# Patient Record
Sex: Female | Born: 1960 | Race: White | Hispanic: No | Smoking: Former smoker
Health system: Southern US, Community
[De-identification: ages and names within clinical notes are randomized; demographics above are authoritative.]

## PROBLEM LIST (undated history)

## (undated) DIAGNOSIS — J45909 Unspecified asthma, uncomplicated: Secondary | ICD-10-CM

## (undated) DIAGNOSIS — I10 Essential (primary) hypertension: Secondary | ICD-10-CM

---

## 1998-04-08 ENCOUNTER — Other Ambulatory Visit: Admission: RE | Admit: 1998-04-08 | Discharge: 1998-04-08 | Payer: Self-pay | Admitting: *Deleted

## 1998-08-30 ENCOUNTER — Encounter: Payer: Self-pay | Admitting: Emergency Medicine

## 1998-08-30 ENCOUNTER — Emergency Department (HOSPITAL_COMMUNITY): Admission: EM | Admit: 1998-08-30 | Discharge: 1998-08-30 | Payer: Self-pay | Admitting: Internal Medicine

## 1999-04-27 ENCOUNTER — Other Ambulatory Visit: Admission: RE | Admit: 1999-04-27 | Discharge: 1999-04-27 | Payer: Self-pay | Admitting: *Deleted

## 2000-05-11 ENCOUNTER — Other Ambulatory Visit: Admission: RE | Admit: 2000-05-11 | Discharge: 2000-05-11 | Payer: Self-pay | Admitting: *Deleted

## 2001-05-11 ENCOUNTER — Other Ambulatory Visit: Admission: RE | Admit: 2001-05-11 | Discharge: 2001-05-11 | Payer: Self-pay | Admitting: *Deleted

## 2005-08-05 ENCOUNTER — Other Ambulatory Visit: Admission: RE | Admit: 2005-08-05 | Discharge: 2005-08-05 | Payer: Self-pay | Admitting: *Deleted

## 2005-12-27 ENCOUNTER — Other Ambulatory Visit: Admission: RE | Admit: 2005-12-27 | Discharge: 2005-12-27 | Payer: Self-pay | Admitting: *Deleted

## 2006-05-10 ENCOUNTER — Other Ambulatory Visit: Admission: RE | Admit: 2006-05-10 | Discharge: 2006-05-10 | Payer: Self-pay | Admitting: *Deleted

## 2010-08-07 ENCOUNTER — Other Ambulatory Visit: Payer: Self-pay | Admitting: Gynecology

## 2010-08-07 DIAGNOSIS — R928 Other abnormal and inconclusive findings on diagnostic imaging of breast: Secondary | ICD-10-CM

## 2010-08-13 ENCOUNTER — Other Ambulatory Visit: Payer: Self-pay

## 2010-09-02 ENCOUNTER — Ambulatory Visit
Admission: RE | Admit: 2010-09-02 | Discharge: 2010-09-02 | Disposition: A | Discharge status: Home / Self Care | Payer: 59 | Source: Ambulatory Visit | Attending: Gynecology | Admitting: Gynecology

## 2010-09-02 DIAGNOSIS — R928 Other abnormal and inconclusive findings on diagnostic imaging of breast: Secondary | ICD-10-CM

## 2010-11-26 ENCOUNTER — Emergency Department (HOSPITAL_COMMUNITY)
Admission: EM | Admit: 2010-11-26 | Discharge: 2010-11-26 | Disposition: A | Discharge status: Other Acute Inpatient Hospital | Payer: 59 | Attending: Emergency Medicine | Admitting: Emergency Medicine

## 2010-11-26 DIAGNOSIS — F489 Nonpsychotic mental disorder, unspecified: Secondary | ICD-10-CM | POA: Insufficient documentation

## 2010-11-26 DIAGNOSIS — T50902A Poisoning by unspecified drugs, medicaments and biological substances, intentional self-harm, initial encounter: Secondary | ICD-10-CM | POA: Insufficient documentation

## 2010-11-26 DIAGNOSIS — F329 Major depressive disorder, single episode, unspecified: Secondary | ICD-10-CM | POA: Insufficient documentation

## 2010-11-26 DIAGNOSIS — T50901A Poisoning by unspecified drugs, medicaments and biological substances, accidental (unintentional), initial encounter: Secondary | ICD-10-CM | POA: Insufficient documentation

## 2010-11-26 DIAGNOSIS — F3289 Other specified depressive episodes: Secondary | ICD-10-CM | POA: Insufficient documentation

## 2010-11-26 DIAGNOSIS — Y92009 Unspecified place in unspecified non-institutional (private) residence as the place of occurrence of the external cause: Secondary | ICD-10-CM | POA: Insufficient documentation

## 2010-11-26 LAB — URINALYSIS, ROUTINE W REFLEX MICROSCOPIC
Bilirubin Urine: NEGATIVE
Glucose, UA: NEGATIVE mg/dL
Ketones, ur: NEGATIVE mg/dL
Leukocytes, UA: NEGATIVE
Nitrite: NEGATIVE
Protein, ur: NEGATIVE mg/dL
pH: 6 (ref 5.0–8.0)

## 2010-11-26 LAB — SALICYLATE LEVEL: Salicylate Lvl: <2 mg/dL — ABNORMAL LOW (ref 2.8–20.0)

## 2010-11-26 LAB — RAPID URINE DRUG SCREEN, HOSP PERFORMED
Amphetamines: NOT DETECTED
Benzodiazepines: POSITIVE — AB
Tetrahydrocannabinol: NOT DETECTED

## 2010-11-26 LAB — DIFFERENTIAL
Basophils Absolute: 0.1 10*3/uL (ref 0.0–0.1)
Basophils Relative: 1 % (ref 0–1)
Eosinophils Absolute: 0.2 10*3/uL (ref 0.0–0.7)
Eosinophils Relative: 2 % (ref 0–5)
Lymphocytes Relative: 20 % (ref 12–46)
Lymphs Abs: 1.6 10*3/uL (ref 0.7–4.0)
Monocytes Absolute: 0.6 10*3/uL (ref 0.1–1.0)
Monocytes Relative: 7 % (ref 3–12)
Neutro Abs: 5.7 10*3/uL (ref 1.7–7.7)
Neutrophils Relative %: 70 % (ref 43–77)

## 2010-11-26 LAB — COMPREHENSIVE METABOLIC PANEL
ALT: 21 U/L (ref 0–35)
AST: 23 U/L (ref 0–37)
Albumin: 3.7 g/dL (ref 3.5–5.2)
CO2: 33 mEq/L — ABNORMAL HIGH (ref 19–32)
Chloride: 100 mEq/L (ref 96–112)
GFR calc non Af Amer: >60 mL/min (ref >60)
Potassium: 4.5 mEq/L (ref 3.5–5.1)
Sodium: 138 mEq/L (ref 135–145)
Total Bilirubin: 0.4 mg/dL (ref 0.3–1.2)

## 2010-11-26 LAB — CBC
Hemoglobin: 14.7 g/dL (ref 12.0–15.0)
MCH: 27.9 pg (ref 26.0–34.0)
Platelets: 339 10*3/uL (ref 150–400)
RBC: 5.27 MIL/uL — ABNORMAL HIGH (ref 3.87–5.11)
WBC: 8.1 10*3/uL (ref 4.0–10.5)

## 2010-11-26 LAB — PREGNANCY, URINE: Preg Test, Ur: NEGATIVE

## 2010-11-26 LAB — ACETAMINOPHEN LEVEL: Acetaminophen (Tylenol), Serum: <15 ug/mL (ref 10–30)

## 2010-11-26 LAB — TRICYCLICS SCREEN, URINE: TCA Scrn: NOT DETECTED

## 2010-11-27 ENCOUNTER — Inpatient Hospital Stay (HOSPITAL_COMMUNITY)
Admission: AD | Admit: 2010-11-27 | Discharge: 2010-11-30 | DRG: 885 | Disposition: A | Discharge status: Home / Self Care | Payer: 59 | Source: Ambulatory Visit | Attending: Psychiatry | Admitting: Psychiatry

## 2010-11-27 DIAGNOSIS — F339 Major depressive disorder, recurrent, unspecified: Secondary | ICD-10-CM

## 2010-11-27 DIAGNOSIS — F411 Generalized anxiety disorder: Secondary | ICD-10-CM

## 2010-11-27 DIAGNOSIS — T424X4A Poisoning by benzodiazepines, undetermined, initial encounter: Secondary | ICD-10-CM

## 2010-11-27 DIAGNOSIS — F332 Major depressive disorder, recurrent severe without psychotic features: Principal | ICD-10-CM

## 2010-11-27 DIAGNOSIS — Z882 Allergy status to sulfonamides status: Secondary | ICD-10-CM

## 2010-11-27 DIAGNOSIS — Z79899 Other long term (current) drug therapy: Secondary | ICD-10-CM

## 2010-11-27 DIAGNOSIS — I10 Essential (primary) hypertension: Secondary | ICD-10-CM

## 2010-11-27 DIAGNOSIS — T43502A Poisoning by unspecified antipsychotics and neuroleptics, intentional self-harm, initial encounter: Secondary | ICD-10-CM

## 2010-11-27 DIAGNOSIS — T438X2A Poisoning by other psychotropic drugs, intentional self-harm, initial encounter: Secondary | ICD-10-CM

## 2010-11-27 DIAGNOSIS — J45909 Unspecified asthma, uncomplicated: Secondary | ICD-10-CM
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
## 2010-11-28 DIAGNOSIS — F411 Generalized anxiety disorder: Secondary | ICD-10-CM

## 2010-11-28 DIAGNOSIS — F339 Major depressive disorder, recurrent, unspecified: Secondary | ICD-10-CM

## 2010-12-02 NOTE — Discharge Summary (Signed)
  NAMEJENINA, Kristin Morrison                ACCOUNT NO.:  [id number]  MEDICAL RECORD NO.:  [id number]  LOCATION:  0500                          FACILITY:  [facility]  PHYSICIAN:  Franchot Gallo, MD     DATE OF BIRTH:  1961/02/07  DATE OF ADMISSION:  11/27/2010 DATE OF DISCHARGE:  11/30/2010                              DISCHARGE SUMMARY   REASON FOR ADMISSION:  This is a 50 year old female that presented after an overdose of 16 to 4 Xanax and drinking approximately 6 beers.  She was having significant stressors, not wanting to "be here."  She had not shown up for work, and her boss had called the patient's mother, and police were dispatched to the patient's home.  FINAL IMPRESSION:  Axis I: 1. Major depressive disorder, recurrent, good control. 2. Generalized anxiety disorder, good control. Axis II:  Deferred. Axis III:  History of asthma under good control and hypertension. Axis IV:  Work-related stress. Axis V:  GAF at discharge was 70.  PERTINENT LABS:  Alcohol level was less than 11.  Urine drug screen positive for benzodiazepines.  Acetaminophen level less than 15.  SIGNIFICANT FINDINGS:  The patient was admitted to the adult milieu.  We continued with her Cymbalta, resumed her losartan and encouraged participation in groups.  She was beginning to feel better.  She was getting some benefit from the Neurontin that was available for her anxiety.  She was denying any suicidal or homicidal thoughts.  She was sleeping, and her appetite was improving.  Her depressive symptoms had lessened rating it a 2 on a scale of 1-10.  She felt that her afternoon dose of Cymbalta was keeping her awake so Cymbalta was changed to all a.m. dosing.  We contacted the patient's mother to gather any safety concerns and for [place] to provide information and suicide prevention and education.  On the day of discharge, the patient reported a problems with decreased sleep but a good appetite, having mild depressive  symptoms rating it a 1-2 on a scale of 1-10.  She denied any suicidal or homicidal thoughts or any psychotic symptoms.  She rated her hopelessness a zero on a scale of 1- 10.  Her Cymbalta was changed to 90 mg in the morning.  DISCHARGE MEDICATIONS:  Her discharge medications included: 1. Gabapentin 100 mg taking 2 t.i.d. 2. Trazodone 50 mg nightly. 3. Cymbalta 30 mg taking 3 capsules daily. 4. Albuterol inhaler as needed. 5. Losartan 50/12.5 mg, 1 daily.  FOLLOWUP:  Her follow-up appointment was with [facility] outpatient with Christella Scheuermann, phone number [phone] on September 7th at 9:00 a.m. and [facility] outpatient with Verne Spurr on October 9th at 8:45.     Landry Corporal, N.P.   ______________________________ Franchot Gallo, MD    JO/MEDQ  D:  11/30/2010  T:  11/30/2010  Job:  [id number]  Electronically Signed by Limmie PatriciaP. on 12/01/2010 09:55:33 AM Electronically Signed by Franchot Gallo MD on 12/02/2010 01:19:16 PM

## 2010-12-04 ENCOUNTER — Ambulatory Visit (HOSPITAL_COMMUNITY): Payer: 59 | Admitting: Psychology

## 2010-12-04 DIAGNOSIS — F411 Generalized anxiety disorder: Secondary | ICD-10-CM

## 2010-12-15 ENCOUNTER — Encounter (HOSPITAL_COMMUNITY): Payer: 59 | Admitting: Psychology

## 2010-12-15 DIAGNOSIS — F332 Major depressive disorder, recurrent severe without psychotic features: Secondary | ICD-10-CM

## 2010-12-25 ENCOUNTER — Encounter (HOSPITAL_COMMUNITY): Payer: 59 | Admitting: Psychology

## 2010-12-29 ENCOUNTER — Encounter (HOSPITAL_COMMUNITY): Payer: 59 | Admitting: Psychology

## 2010-12-29 DIAGNOSIS — F332 Major depressive disorder, recurrent severe without psychotic features: Secondary | ICD-10-CM

## 2011-01-05 ENCOUNTER — Encounter (HOSPITAL_COMMUNITY): Payer: 59 | Admitting: Physician Assistant

## 2011-01-22 ENCOUNTER — Encounter (HOSPITAL_COMMUNITY): Payer: 59 | Admitting: Psychology

## 2011-08-13 ENCOUNTER — Other Ambulatory Visit: Payer: Self-pay | Admitting: Family Medicine

## 2011-08-13 DIAGNOSIS — R319 Hematuria, unspecified: Secondary | ICD-10-CM

## 2011-08-18 ENCOUNTER — Inpatient Hospital Stay: Admission: RE | Admit: 2011-08-18 | Payer: 59 | Source: Ambulatory Visit

## 2011-11-04 ENCOUNTER — Other Ambulatory Visit: Payer: 59

## 2011-11-09 ENCOUNTER — Ambulatory Visit
Admission: RE | Admit: 2011-11-09 | Discharge: 2011-11-09 | Disposition: A | Discharge status: Home / Self Care | Payer: 59 | Source: Ambulatory Visit | Attending: Family Medicine | Admitting: Family Medicine

## 2011-11-09 DIAGNOSIS — R319 Hematuria, unspecified: Secondary | ICD-10-CM

## 2011-11-09 MED ORDER — IOHEXOL 300 MG/ML  SOLN
125.0000 mL | Freq: Once | INTRAMUSCULAR | Status: AC | PRN
  Administered 2011-11-09: 125 mL via INTRAVENOUS

## 2012-08-30 IMAGING — CT CT ABD-PEL WO/W CM
4 of 10 series · 16 of 36 positions shown, 19 images · IV contrast (WATER & [ID] OMNI 300)
Comparison: None.

CLINICAL DATA: Hematuria, bilateral pelvic pain

CT ABDOMEN AND PELVIS WITHOUT AND WITH CONTRAST
TECHNIQUE: Multidetector CT imaging of the abdomen and pelvis was
performed without contrast material in one or both body regions,
followed by contrast material(s) and further sections in one or
both body regions.
Contrast: 125mL OMNIPAQUE IOHEXOL 300 MG/ML  SOLN

[Series 9: abd/pelvis prone · axial · 0.70mm/px · z∈[-242,-112]mm · 2 of 80 slices shown, 5 images]
[im 27/80  soft-tissue]
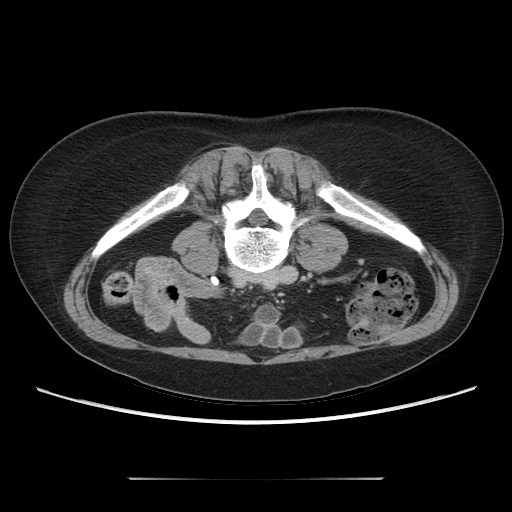
[im 27/80  lung]
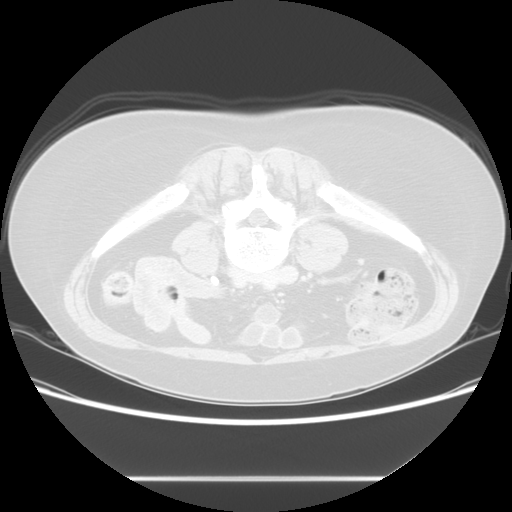
[im 27/80  bone]
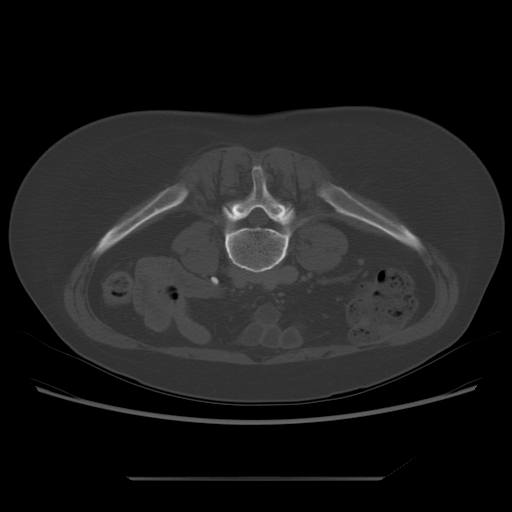
[im 53/80  soft-tissue]
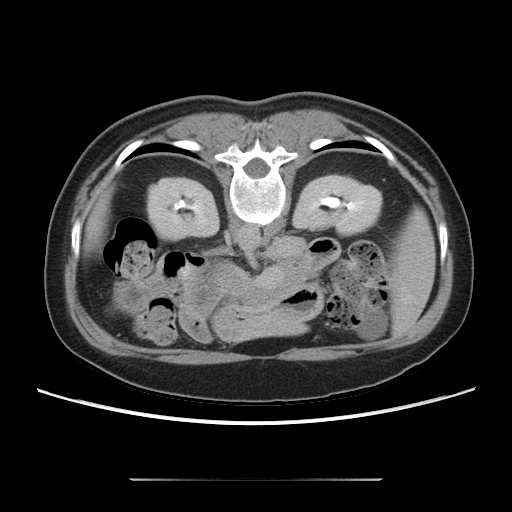
[im 53/80  lung]
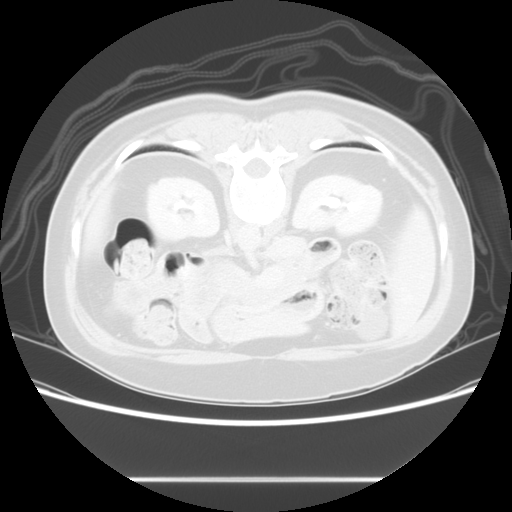

[Series 602: sagittal body · sagittal · 0.89mm/px · 5 of 145 slices shown (1 of 3)]
[im 25/145  soft-tissue]
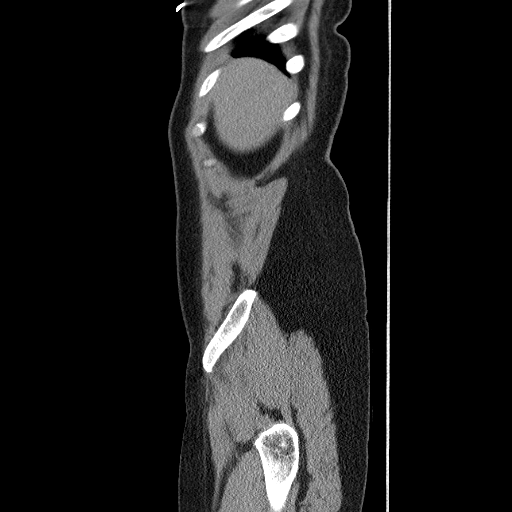
[im 49/145  soft-tissue]
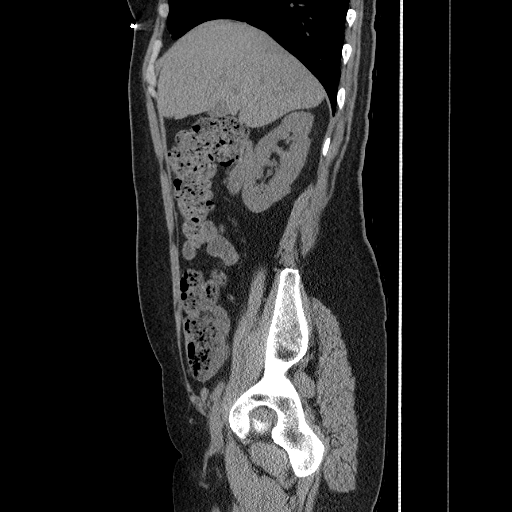
[im 73/145  soft-tissue]
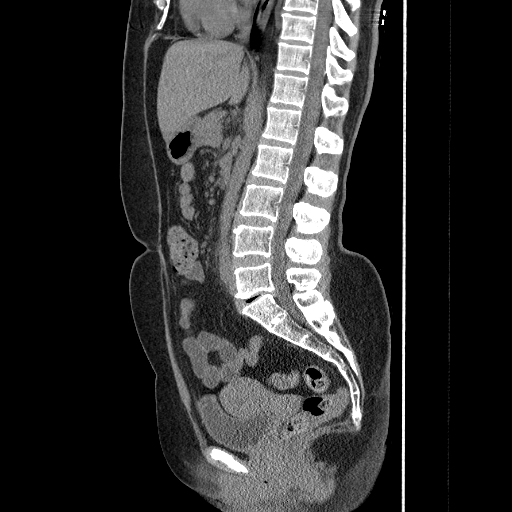
[im 97/145  soft-tissue]
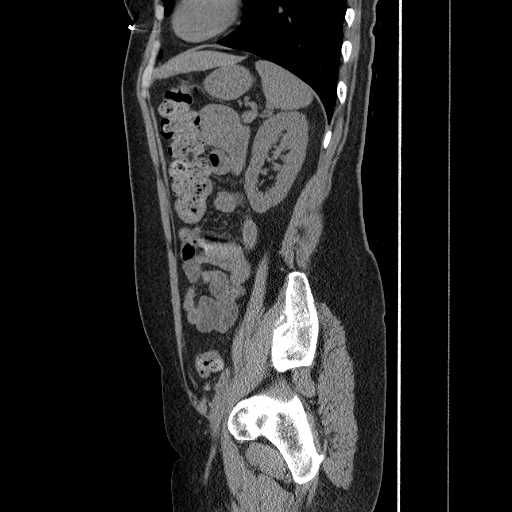
[im 121/145  soft-tissue]
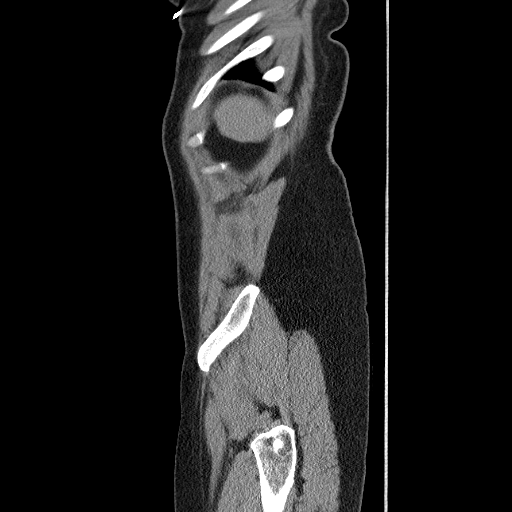

[Series 605: sagittal body · sagittal · 0.91mm/px · 5 of 145 slices shown (2 of 3)]
[im 25/145  soft-tissue]
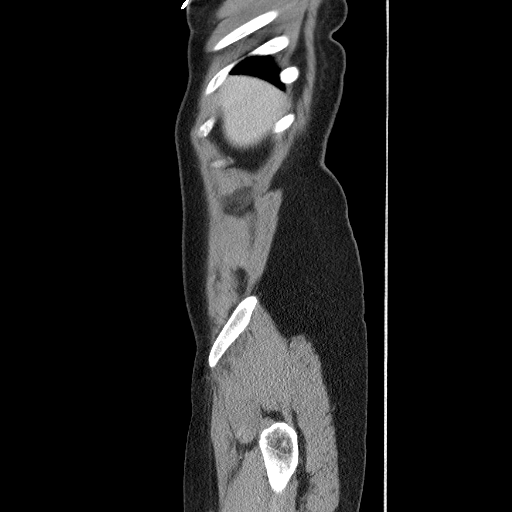
[im 49/145  soft-tissue]
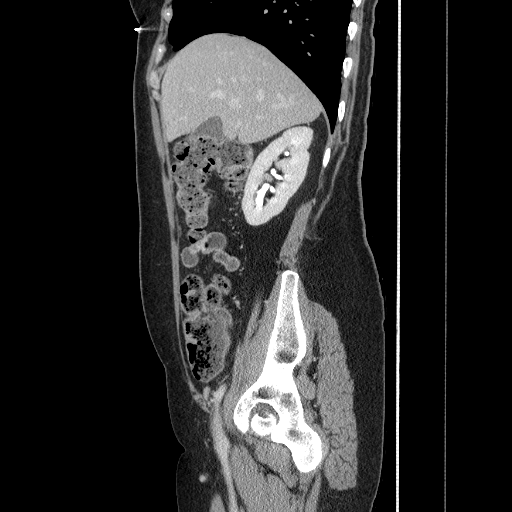
[im 73/145  soft-tissue]
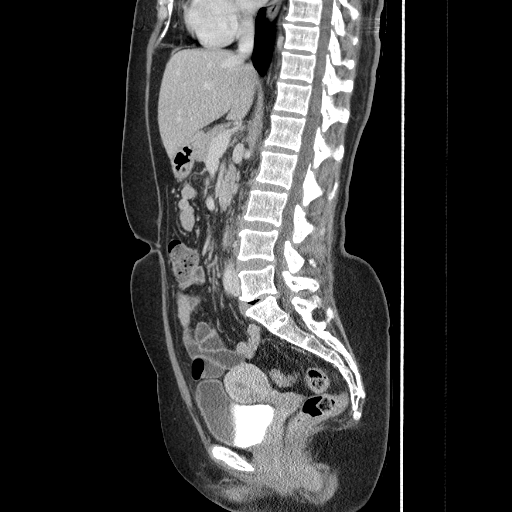
[im 97/145  soft-tissue]
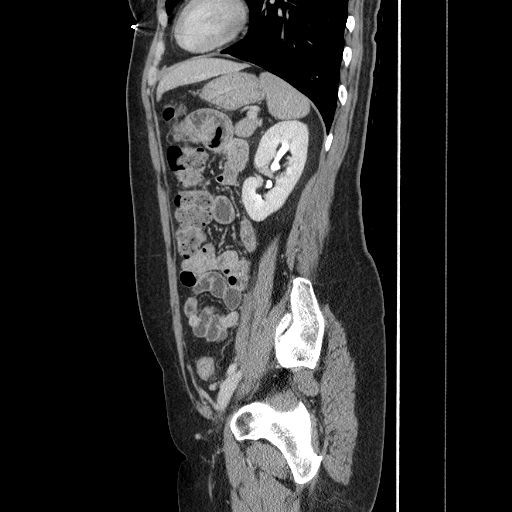
[im 121/145  soft-tissue]
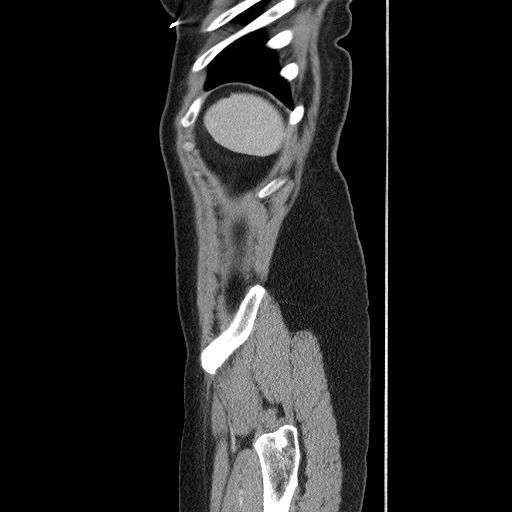

[Series 608: sagittal body · sagittal · 0.92mm/px · 4 of 145 slices shown (3 of 3)]
[im 25/145  soft-tissue]
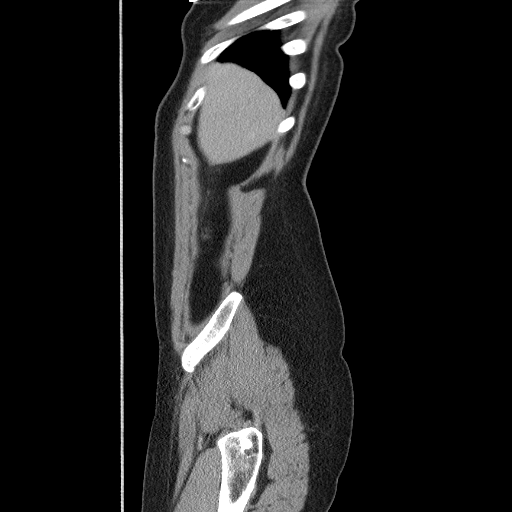
[im 49/145  soft-tissue]
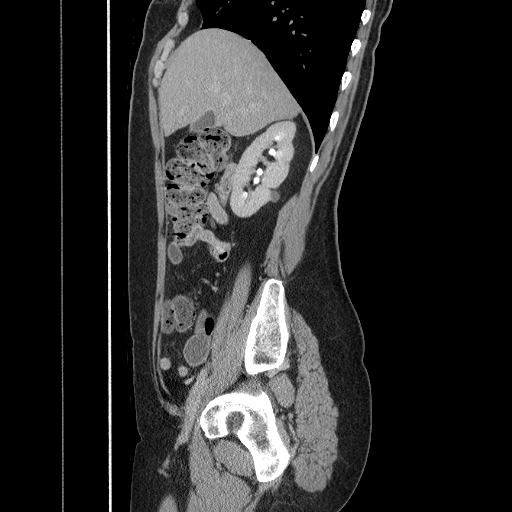
[im 73/145  soft-tissue]
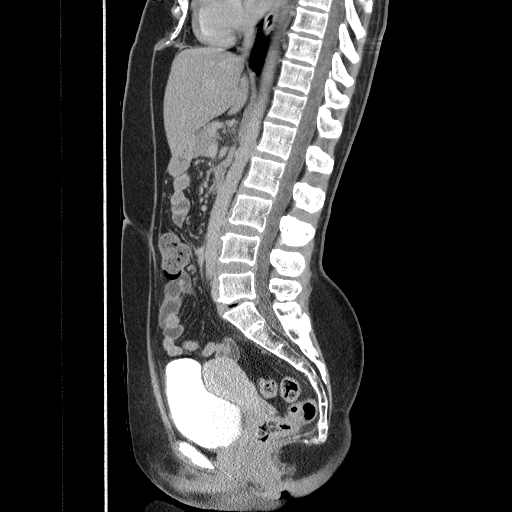
[im 97/145  soft-tissue]
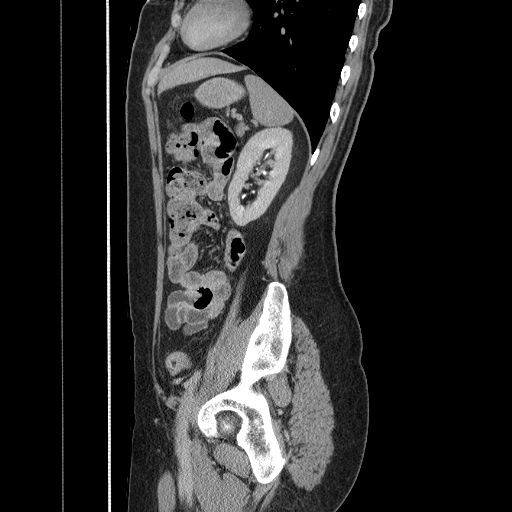

[16 of 36 positions shown; findings below may reference images not displayed]

FINDINGS: Renal:  Non-IV contrast images demonstrate no nephrolithiasis or
ureterolithiasis.  Delayed pyelogram phase imaging demonstrates no
enhancing renal cortical lesions.  There are several small renal
hypodensities likely representing cysts but are too small to
characterize.  No filling defects within the collecting systems or
ureters.

The right lung bases are clear.  No pericardial fluid.  No focal
hepatic lesion.  The gallbladder, pancreas, spleen, adrenal glands
are normal.

The stomach, small bowel, appendix, and colon are normal.

Abdominal aorta normal caliber.  No retroperitoneal periportal
lymphadenopathy.

No free fluid the pelvis.  The bladder, uterus, ovaries are normal.
No pelvic lymphadenopathy.
IMPRESSION: 1..  No explanation for  hematuria.  No nephrolithiasis,
ureterolithiasis, enhancing renal cortical lesion, or filling
defects within the collecting systems.

2.  No acute findings in the abdomen pelvis.

## 2017-05-27 HISTORY — PX: MOUTH SURGERY: SHX715

## 2017-06-15 DIAGNOSIS — F4010 Social phobia, unspecified: Secondary | ICD-10-CM | POA: Diagnosis not present

## 2017-06-15 DIAGNOSIS — F419 Anxiety disorder, unspecified: Secondary | ICD-10-CM | POA: Diagnosis not present

## 2017-06-15 DIAGNOSIS — F338 Other recurrent depressive disorders: Secondary | ICD-10-CM | POA: Diagnosis not present

## 2017-06-15 DIAGNOSIS — R41840 Attention and concentration deficit: Secondary | ICD-10-CM | POA: Diagnosis not present

## 2017-06-15 DIAGNOSIS — F429 Obsessive-compulsive disorder, unspecified: Secondary | ICD-10-CM | POA: Diagnosis not present

## 2017-06-16 DIAGNOSIS — J069 Acute upper respiratory infection, unspecified: Secondary | ICD-10-CM | POA: Diagnosis not present

## 2017-07-06 DIAGNOSIS — Z01419 Encounter for gynecological examination (general) (routine) without abnormal findings: Secondary | ICD-10-CM | POA: Diagnosis not present

## 2017-07-07 DIAGNOSIS — Z79899 Other long term (current) drug therapy: Secondary | ICD-10-CM | POA: Diagnosis not present

## 2017-07-07 DIAGNOSIS — F902 Attention-deficit hyperactivity disorder, combined type: Secondary | ICD-10-CM | POA: Diagnosis not present

## 2017-08-14 ENCOUNTER — Emergency Department (HOSPITAL_COMMUNITY)
Admission: EM | Admit: 2017-08-14 | Discharge: 2017-08-14 | Disposition: A | Discharge status: Home / Self Care | Payer: BLUE CROSS/BLUE SHIELD | Attending: Emergency Medicine | Admitting: Emergency Medicine

## 2017-08-14 ENCOUNTER — Emergency Department (HOSPITAL_COMMUNITY): Payer: BLUE CROSS/BLUE SHIELD

## 2017-08-14 ENCOUNTER — Encounter (HOSPITAL_COMMUNITY): Payer: Self-pay

## 2017-08-14 DIAGNOSIS — J209 Acute bronchitis, unspecified: Secondary | ICD-10-CM | POA: Insufficient documentation

## 2017-08-14 DIAGNOSIS — J45909 Unspecified asthma, uncomplicated: Secondary | ICD-10-CM | POA: Insufficient documentation

## 2017-08-14 DIAGNOSIS — R0981 Nasal congestion: Secondary | ICD-10-CM | POA: Diagnosis not present

## 2017-08-14 DIAGNOSIS — Z87891 Personal history of nicotine dependence: Secondary | ICD-10-CM | POA: Diagnosis not present

## 2017-08-14 DIAGNOSIS — I10 Essential (primary) hypertension: Secondary | ICD-10-CM | POA: Diagnosis not present

## 2017-08-14 DIAGNOSIS — R05 Cough: Secondary | ICD-10-CM | POA: Diagnosis not present

## 2017-08-14 DIAGNOSIS — R059 Cough, unspecified: Secondary | ICD-10-CM

## 2017-08-14 DIAGNOSIS — R0602 Shortness of breath: Secondary | ICD-10-CM | POA: Diagnosis not present

## 2017-08-14 DIAGNOSIS — R062 Wheezing: Secondary | ICD-10-CM | POA: Diagnosis not present

## 2017-08-14 DIAGNOSIS — J40 Bronchitis, not specified as acute or chronic: Secondary | ICD-10-CM

## 2017-08-14 HISTORY — DX: Unspecified asthma, uncomplicated: J45.909

## 2017-08-14 HISTORY — DX: Essential (primary) hypertension: I10

## 2017-08-14 MED ORDER — DEXAMETHASONE SODIUM PHOSPHATE 10 MG/ML IJ SOLN
10.0000 mg | Freq: Once | INTRAMUSCULAR | Status: AC
  Administered 2017-08-14: 10 mg via INTRAMUSCULAR
  Filled 2017-08-14: qty 1

## 2017-08-14 MED ORDER — ALBUTEROL SULFATE (2.5 MG/3ML) 0.083% IN NEBU
5.0000 mg | INHALATION_SOLUTION | Freq: Once | RESPIRATORY_TRACT | Status: AC
  Administered 2017-08-14: 5 mg via RESPIRATORY_TRACT
  Filled 2017-08-14: qty 6

## 2017-08-14 MED ORDER — AZITHROMYCIN 250 MG PO TABS: 250.0000 mg | ORAL_TABLET | Freq: Every day | ORAL | 0 refills | Status: AC

## 2017-08-14 NOTE — ED Triage Notes (Signed)
Pt has had cough x 4 weeks, was advised to use Mucinex DM.  Pt had stopped using until today when chest felt tight.  Pt bringing into bag to help her calm down.  Talking in complete sentences.

## 2017-08-14 NOTE — Discharge Instructions (Signed)
It was my pleasure taking care of you today!   Please take all of your antibiotics until finished!  Continue using your inhaler as needed.   Please follow-up with your primary care doctor for recheck if symptoms continue to persist longer than 1 week.  Return to emergency department for new or worsening symptoms, any additional concerns.

## 2017-08-14 NOTE — ED Provider Notes (Signed)
[facility] EMERGENCY DEPARTMENT Provider Note   CSN: [id number] Arrival date & time: 08/14/17  1834     History   Chief Complaint Chief Complaint  Patient presents with  . Asthma    HPI TREESA MCCULLY is a 57 y.o. female.  The history is provided by the patient and medical records. No language interpreter was used.  Asthma  Pertinent negatives include no chest pain, no abdominal pain and no shortness of breath.   CATALEYA CRISTINA is a 57 y.o. female  with a PMH of asthma, HTN who presents to the Emergency Department complaining of persistent cough for the last 3 to 4 weeks.  She was seen by PCP at onset of symptoms where she was encouraged to start Mucinex DM and Zyrtec.  She has been taking these as directed with little improvement.  About a week ago, she felt as if her symptoms worsened.  She noticed herself wheezing.  She does have history of asthma and reports feeling similar to a mild asthma flare.  She has been using her inhaler with fairly decent relief in symptoms.  She tried to call a telemetry doc today, but they stated that they could not call her in any prescriptions as they cannot listen to her lungs and recommended that she be seen in person.  Denies any fever or chills.  No leg pain or swelling.  No long travel/surgeries/immobilizations.  No chest pain or shortness of breath.  Past Medical History:  Diagnosis Date  . Asthma   . Hypertension     There are no active problems to display for this patient.   Past Surgical History:  Procedure Laterality Date  . MOUTH SURGERY  05/2017   bone graft      OB History   None      Home Medications    Prior to Admission medications   Not on File    Family History History reviewed. No pertinent family history.  Social History Social History   Tobacco Use  . Smoking status: Former Games developer  . Smokeless tobacco: Never Used  Substance Use Topics  . Alcohol use: Yes    Comment: rare  . Drug  use: Never     Allergies   Benadryl [diphenhydramine] and Sulfa antibiotics   Review of Systems Review of Systems  HENT: Positive for congestion. Negative for sore throat.   Respiratory: Positive for cough and wheezing. Negative for shortness of breath.   Cardiovascular: Negative for chest pain and leg swelling.  Gastrointestinal: Negative for abdominal pain, nausea and vomiting.  All other systems reviewed and are negative.    Physical Exam Updated Vital Signs BP 135/82 (BP Location: Right Arm)   Pulse 85   Temp 99.2 F (37.3 C) (Oral)   Resp 20   LMP 08/22/2010   SpO2 100%   Physical Exam  Constitutional: She is oriented to person, place, and time. She appears well-developed and well-nourished. No distress.  HENT:  Head: Normocephalic and atraumatic.  + PND  Cardiovascular: Normal rate, regular rhythm and normal heart sounds.  No murmur heard. Pulmonary/Chest: Effort normal and breath sounds normal. No respiratory distress.  Lungs are clear to auscultation bilaterally.  Patient was given DuoNeb treatment prior to my evaluation.  She feels as if she was wheezing when she arrived, but does not believe she is wheezing now.  She is speaking in full sentences without difficulty.  Abdominal: Soft. She exhibits no distension. There is no tenderness.  Musculoskeletal:  No lower extremity swelling or calf tenderness.  Neurological: She is alert and oriented to person, place, and time.  Skin: Skin is warm and dry.  Nursing note and vitals reviewed.    ED Treatments / Results  Labs (all labs ordered are listed, but only abnormal results are displayed) Labs Reviewed - No data to display  EKG None  Radiology Dg Chest 2 View  Result Date: 08/14/2017 CLINICAL DATA:  Cough for 4 weeks.  Shortness of breath today. EXAM: CHEST - 2 VIEW COMPARISON:  01/11/2013 chest radiograph FINDINGS: The cardiomediastinal silhouette is unremarkable. There is no evidence of focal airspace  disease, pulmonary edema, suspicious pulmonary nodule/mass, pleural effusion, or pneumothorax. No acute bony abnormalities are identified. IMPRESSION: No active cardiopulmonary disease. Electronically Signed   By: Harmon Pier M.D.   On: 08/14/2017 19:46    Procedures Procedures (including critical care time)  Medications Ordered in ED Medications  dexamethasone (DECADRON) injection 10 mg (has no administration in time range)  albuterol (PROVENTIL) (2.5 MG/3ML) 0.083% nebulizer solution 5 mg (5 mg Nebulization Given 08/14/17 1901)     Initial Impression / Assessment and Plan / ED Course  I have reviewed the triage vital signs and the nursing notes.  Pertinent labs & imaging results that were available during my care of the patient were reviewed by me and considered in my medical decision making (see chart for details).    VANICE RAPPA is a 57 y.o. female who presents to ED for persistent cough over the last 3 to 4 weeks.  Initially seen by PCP who recommended Zyrtec and Mucinex DM.  Her symptoms have been persistent.  She believes that worsened over the last week.  On exam, patient is afebrile, hemodynamically stable with clear lungs.  She did receive DuoNeb prior to my evaluation.  Patient states that she felt as if she was wheezing when she arrived, but believes neb treatment helped with this a great deal.  Given persistent cough, PE considered, however she is low risk Wells and symptoms highly atypical.  Doubt PE.  Chest x-ray reassuring with no signs of pneumonia.  Will provide Decadron shot in the ED and treat with Zithromax given long duration of her symptoms.  She agrees to follow-up with her primary care doctor for reevaluation.  She has enough of her albuterol inhaler at home.  Reasons to return to the emergency department discussed and all questions answered.  Final Clinical Impressions(s) / ED Diagnoses   Final diagnoses:  Cough  Bronchitis    ED Discharge Orders    None        Ward, Chase Picket, PA-C 08/14/17 2005    Terrilee Files, MD 08/15/17 1147

## 2017-08-19 DIAGNOSIS — J069 Acute upper respiratory infection, unspecified: Secondary | ICD-10-CM | POA: Diagnosis not present

## 2017-10-07 DIAGNOSIS — Z79899 Other long term (current) drug therapy: Secondary | ICD-10-CM | POA: Diagnosis not present

## 2017-10-07 DIAGNOSIS — F902 Attention-deficit hyperactivity disorder, combined type: Secondary | ICD-10-CM | POA: Diagnosis not present

## 2017-10-07 DIAGNOSIS — F419 Anxiety disorder, unspecified: Secondary | ICD-10-CM | POA: Diagnosis not present

## 2017-10-11 DIAGNOSIS — N951 Menopausal and female climacteric states: Secondary | ICD-10-CM | POA: Diagnosis not present

## 2017-10-20 DIAGNOSIS — R140 Abdominal distension (gaseous): Secondary | ICD-10-CM | POA: Diagnosis not present

## 2017-10-20 DIAGNOSIS — K5904 Chronic idiopathic constipation: Secondary | ICD-10-CM | POA: Diagnosis not present

## 2017-10-20 DIAGNOSIS — Z1211 Encounter for screening for malignant neoplasm of colon: Secondary | ICD-10-CM | POA: Diagnosis not present

## 2017-10-20 DIAGNOSIS — Z800 Family history of malignant neoplasm of digestive organs: Secondary | ICD-10-CM | POA: Diagnosis not present

## 2017-11-02 DIAGNOSIS — R7301 Impaired fasting glucose: Secondary | ICD-10-CM | POA: Diagnosis not present

## 2017-11-02 DIAGNOSIS — E785 Hyperlipidemia, unspecified: Secondary | ICD-10-CM | POA: Diagnosis not present

## 2017-11-02 DIAGNOSIS — I10 Essential (primary) hypertension: Secondary | ICD-10-CM | POA: Diagnosis not present

## 2017-11-02 DIAGNOSIS — F419 Anxiety disorder, unspecified: Secondary | ICD-10-CM | POA: Diagnosis not present

## 2017-11-03 DIAGNOSIS — I10 Essential (primary) hypertension: Secondary | ICD-10-CM | POA: Diagnosis not present

## 2017-11-03 DIAGNOSIS — E785 Hyperlipidemia, unspecified: Secondary | ICD-10-CM | POA: Diagnosis not present

## 2017-11-15 DIAGNOSIS — M7052 Other bursitis of knee, left knee: Secondary | ICD-10-CM | POA: Diagnosis not present

## 2017-11-15 DIAGNOSIS — M7051 Other bursitis of knee, right knee: Secondary | ICD-10-CM | POA: Diagnosis not present

## 2017-11-15 DIAGNOSIS — M2241 Chondromalacia patellae, right knee: Secondary | ICD-10-CM | POA: Diagnosis not present

## 2017-11-15 DIAGNOSIS — M2242 Chondromalacia patellae, left knee: Secondary | ICD-10-CM | POA: Diagnosis not present

## 2017-11-30 DIAGNOSIS — M2241 Chondromalacia patellae, right knee: Secondary | ICD-10-CM | POA: Diagnosis not present

## 2017-11-30 DIAGNOSIS — M2242 Chondromalacia patellae, left knee: Secondary | ICD-10-CM | POA: Diagnosis not present

## 2017-12-02 DIAGNOSIS — Z1211 Encounter for screening for malignant neoplasm of colon: Secondary | ICD-10-CM | POA: Diagnosis not present

## 2017-12-02 DIAGNOSIS — Z800 Family history of malignant neoplasm of digestive organs: Secondary | ICD-10-CM | POA: Diagnosis not present

## 2017-12-21 DIAGNOSIS — M2241 Chondromalacia patellae, right knee: Secondary | ICD-10-CM | POA: Diagnosis not present

## 2017-12-21 DIAGNOSIS — M2242 Chondromalacia patellae, left knee: Secondary | ICD-10-CM | POA: Diagnosis not present

## 2018-01-10 DIAGNOSIS — Z79899 Other long term (current) drug therapy: Secondary | ICD-10-CM | POA: Diagnosis not present

## 2018-01-10 DIAGNOSIS — F902 Attention-deficit hyperactivity disorder, combined type: Secondary | ICD-10-CM | POA: Diagnosis not present

## 2018-01-10 DIAGNOSIS — F419 Anxiety disorder, unspecified: Secondary | ICD-10-CM | POA: Diagnosis not present

## 2018-01-11 DIAGNOSIS — N951 Menopausal and female climacteric states: Secondary | ICD-10-CM | POA: Diagnosis not present

## 2018-01-11 DIAGNOSIS — F419 Anxiety disorder, unspecified: Secondary | ICD-10-CM | POA: Diagnosis not present

## 2018-04-12 DIAGNOSIS — Z79899 Other long term (current) drug therapy: Secondary | ICD-10-CM | POA: Diagnosis not present

## 2018-04-12 DIAGNOSIS — F419 Anxiety disorder, unspecified: Secondary | ICD-10-CM | POA: Diagnosis not present

## 2018-04-12 DIAGNOSIS — F902 Attention-deficit hyperactivity disorder, combined type: Secondary | ICD-10-CM | POA: Diagnosis not present

## 2018-04-14 DIAGNOSIS — N951 Menopausal and female climacteric states: Secondary | ICD-10-CM | POA: Diagnosis not present

## 2018-04-14 DIAGNOSIS — Z1239 Encounter for other screening for malignant neoplasm of breast: Secondary | ICD-10-CM | POA: Diagnosis not present

## 2018-04-14 DIAGNOSIS — Z79890 Hormone replacement therapy (postmenopausal): Secondary | ICD-10-CM | POA: Diagnosis not present

## 2018-05-02 DIAGNOSIS — R6883 Chills (without fever): Secondary | ICD-10-CM | POA: Diagnosis not present

## 2018-05-12 DIAGNOSIS — G4700 Insomnia, unspecified: Secondary | ICD-10-CM | POA: Diagnosis not present

## 2018-05-12 DIAGNOSIS — Z23 Encounter for immunization: Secondary | ICD-10-CM | POA: Diagnosis not present

## 2018-05-12 DIAGNOSIS — Z1231 Encounter for screening mammogram for malignant neoplasm of breast: Secondary | ICD-10-CM | POA: Diagnosis not present

## 2020-02-15 ENCOUNTER — Ambulatory Visit
Admission: RE | Admit: 2020-02-15 | Discharge: 2020-02-15 | Disposition: A | Discharge status: Home / Self Care | Payer: Managed Care, Other (non HMO) | Source: Ambulatory Visit | Attending: Family Medicine | Admitting: Family Medicine

## 2020-02-15 ENCOUNTER — Other Ambulatory Visit: Payer: Self-pay | Admitting: Family Medicine

## 2020-02-15 DIAGNOSIS — R3129 Other microscopic hematuria: Secondary | ICD-10-CM

## 2020-11-03 ENCOUNTER — Other Ambulatory Visit: Payer: Self-pay | Admitting: Family Medicine

## 2020-11-03 ENCOUNTER — Other Ambulatory Visit: Payer: Self-pay

## 2020-11-03 ENCOUNTER — Ambulatory Visit
Admission: RE | Admit: 2020-11-03 | Discharge: 2020-11-03 | Disposition: A | Discharge status: Home / Self Care | Payer: Managed Care, Other (non HMO) | Source: Ambulatory Visit | Attending: Family Medicine | Admitting: Family Medicine

## 2020-11-03 DIAGNOSIS — S99912A Unspecified injury of left ankle, initial encounter: Secondary | ICD-10-CM

## 2022-11-03 DIAGNOSIS — F419 Anxiety disorder, unspecified: Secondary | ICD-10-CM | POA: Diagnosis not present

## 2022-11-03 DIAGNOSIS — F902 Attention-deficit hyperactivity disorder, combined type: Secondary | ICD-10-CM | POA: Diagnosis not present

## 2022-11-03 DIAGNOSIS — Z79899 Other long term (current) drug therapy: Secondary | ICD-10-CM | POA: Diagnosis not present

## 2022-12-02 DIAGNOSIS — Z1211 Encounter for screening for malignant neoplasm of colon: Secondary | ICD-10-CM | POA: Diagnosis not present

## 2022-12-02 DIAGNOSIS — Z800 Family history of malignant neoplasm of digestive organs: Secondary | ICD-10-CM | POA: Diagnosis not present

## 2022-12-02 DIAGNOSIS — K5904 Chronic idiopathic constipation: Secondary | ICD-10-CM | POA: Diagnosis not present

## 2022-12-07 DIAGNOSIS — R5383 Other fatigue: Secondary | ICD-10-CM | POA: Diagnosis not present

## 2022-12-07 DIAGNOSIS — R7303 Prediabetes: Secondary | ICD-10-CM | POA: Diagnosis not present

## 2022-12-07 DIAGNOSIS — R0789 Other chest pain: Secondary | ICD-10-CM | POA: Diagnosis not present

## 2022-12-07 DIAGNOSIS — E785 Hyperlipidemia, unspecified: Secondary | ICD-10-CM | POA: Diagnosis not present

## 2022-12-08 DIAGNOSIS — J4531 Mild persistent asthma with (acute) exacerbation: Secondary | ICD-10-CM | POA: Diagnosis not present

## 2022-12-08 DIAGNOSIS — J302 Other seasonal allergic rhinitis: Secondary | ICD-10-CM | POA: Diagnosis not present

## 2022-12-08 DIAGNOSIS — Z23 Encounter for immunization: Secondary | ICD-10-CM | POA: Diagnosis not present

## 2022-12-08 DIAGNOSIS — J019 Acute sinusitis, unspecified: Secondary | ICD-10-CM | POA: Diagnosis not present

## 2022-12-16 DIAGNOSIS — J4531 Mild persistent asthma with (acute) exacerbation: Secondary | ICD-10-CM | POA: Diagnosis not present

## 2022-12-29 DIAGNOSIS — J4541 Moderate persistent asthma with (acute) exacerbation: Secondary | ICD-10-CM | POA: Diagnosis not present

## 2022-12-31 DIAGNOSIS — Z23 Encounter for immunization: Secondary | ICD-10-CM | POA: Diagnosis not present

## 2022-12-31 DIAGNOSIS — Z0000 Encounter for general adult medical examination without abnormal findings: Secondary | ICD-10-CM | POA: Diagnosis not present

## 2022-12-31 DIAGNOSIS — Z1389 Encounter for screening for other disorder: Secondary | ICD-10-CM | POA: Diagnosis not present

## 2023-01-10 DIAGNOSIS — Z1231 Encounter for screening mammogram for malignant neoplasm of breast: Secondary | ICD-10-CM | POA: Diagnosis not present

## 2023-01-10 DIAGNOSIS — R92333 Mammographic heterogeneous density, bilateral breasts: Secondary | ICD-10-CM | POA: Diagnosis not present

## 2023-02-14 DIAGNOSIS — F902 Attention-deficit hyperactivity disorder, combined type: Secondary | ICD-10-CM | POA: Diagnosis not present

## 2023-03-17 DIAGNOSIS — Z79899 Other long term (current) drug therapy: Secondary | ICD-10-CM | POA: Diagnosis not present

## 2023-03-17 DIAGNOSIS — F419 Anxiety disorder, unspecified: Secondary | ICD-10-CM | POA: Diagnosis not present

## 2023-03-17 DIAGNOSIS — F902 Attention-deficit hyperactivity disorder, combined type: Secondary | ICD-10-CM | POA: Diagnosis not present

## 2023-03-22 DIAGNOSIS — L219 Seborrheic dermatitis, unspecified: Secondary | ICD-10-CM | POA: Diagnosis not present

## 2023-06-15 DIAGNOSIS — F902 Attention-deficit hyperactivity disorder, combined type: Secondary | ICD-10-CM | POA: Diagnosis not present

## 2023-06-15 DIAGNOSIS — Z79899 Other long term (current) drug therapy: Secondary | ICD-10-CM | POA: Diagnosis not present

## 2023-06-15 DIAGNOSIS — F419 Anxiety disorder, unspecified: Secondary | ICD-10-CM | POA: Diagnosis not present

## 2023-07-01 DIAGNOSIS — I10 Essential (primary) hypertension: Secondary | ICD-10-CM | POA: Diagnosis not present

## 2023-07-01 DIAGNOSIS — F419 Anxiety disorder, unspecified: Secondary | ICD-10-CM | POA: Diagnosis not present

## 2023-07-01 DIAGNOSIS — G4700 Insomnia, unspecified: Secondary | ICD-10-CM | POA: Diagnosis not present

## 2023-07-01 DIAGNOSIS — J4520 Mild intermittent asthma, uncomplicated: Secondary | ICD-10-CM | POA: Diagnosis not present

## 2023-08-10 DIAGNOSIS — M67951 Unspecified disorder of synovium and tendon, right thigh: Secondary | ICD-10-CM | POA: Diagnosis not present

## 2023-08-10 DIAGNOSIS — S39092A Other injury of muscle, fascia and tendon of lower back, initial encounter: Secondary | ICD-10-CM | POA: Diagnosis not present

## 2023-09-14 DIAGNOSIS — B9689 Other specified bacterial agents as the cause of diseases classified elsewhere: Secondary | ICD-10-CM | POA: Diagnosis not present

## 2023-09-14 DIAGNOSIS — J45901 Unspecified asthma with (acute) exacerbation: Secondary | ICD-10-CM | POA: Diagnosis not present

## 2023-09-14 DIAGNOSIS — J019 Acute sinusitis, unspecified: Secondary | ICD-10-CM | POA: Diagnosis not present

## 2023-10-13 DIAGNOSIS — Z79899 Other long term (current) drug therapy: Secondary | ICD-10-CM | POA: Diagnosis not present

## 2023-10-13 DIAGNOSIS — F419 Anxiety disorder, unspecified: Secondary | ICD-10-CM | POA: Diagnosis not present

## 2023-10-13 DIAGNOSIS — F902 Attention-deficit hyperactivity disorder, combined type: Secondary | ICD-10-CM | POA: Diagnosis not present

## 2024-01-06 DIAGNOSIS — I10 Essential (primary) hypertension: Secondary | ICD-10-CM | POA: Diagnosis not present

## 2024-01-06 DIAGNOSIS — Z13220 Encounter for screening for lipoid disorders: Secondary | ICD-10-CM | POA: Diagnosis not present

## 2024-01-06 DIAGNOSIS — Z0000 Encounter for general adult medical examination without abnormal findings: Secondary | ICD-10-CM | POA: Diagnosis not present

## 2024-01-06 DIAGNOSIS — R7303 Prediabetes: Secondary | ICD-10-CM | POA: Diagnosis not present

## 2024-01-06 DIAGNOSIS — F909 Attention-deficit hyperactivity disorder, unspecified type: Secondary | ICD-10-CM | POA: Diagnosis not present

## 2024-01-11 DIAGNOSIS — J4520 Mild intermittent asthma, uncomplicated: Secondary | ICD-10-CM | POA: Diagnosis not present

## 2024-01-11 DIAGNOSIS — M5441 Lumbago with sciatica, right side: Secondary | ICD-10-CM | POA: Diagnosis not present

## 2024-01-11 DIAGNOSIS — J302 Other seasonal allergic rhinitis: Secondary | ICD-10-CM | POA: Diagnosis not present

## 2024-01-11 DIAGNOSIS — S39092A Other injury of muscle, fascia and tendon of lower back, initial encounter: Secondary | ICD-10-CM | POA: Diagnosis not present

## 2024-01-26 DIAGNOSIS — F902 Attention-deficit hyperactivity disorder, combined type: Secondary | ICD-10-CM | POA: Diagnosis not present

## 2024-01-26 DIAGNOSIS — F419 Anxiety disorder, unspecified: Secondary | ICD-10-CM | POA: Diagnosis not present

## 2024-01-26 DIAGNOSIS — Z79899 Other long term (current) drug therapy: Secondary | ICD-10-CM | POA: Diagnosis not present
